# Patient Record
Sex: Female | Born: 2000 | Race: Black or African American | Hispanic: No | Marital: Single | Smoking: Never smoker
Health system: Southern US, Community
[De-identification: ages and names within clinical notes are randomized; demographics above are authoritative.]

## PROBLEM LIST (undated history)

## (undated) DIAGNOSIS — I10 Essential (primary) hypertension: Secondary | ICD-10-CM

## (undated) DIAGNOSIS — J45909 Unspecified asthma, uncomplicated: Secondary | ICD-10-CM

---
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
## 2018-03-23 IMAGING — CR DG CHEST 2V
2 series · 2 of 2 positions shown · non-contrast
Comparison: None.

CLINICAL DATA: Initial evaluation for acute cough, chest pain.

EXAM:
CHEST  2 VIEW

[chest pa]
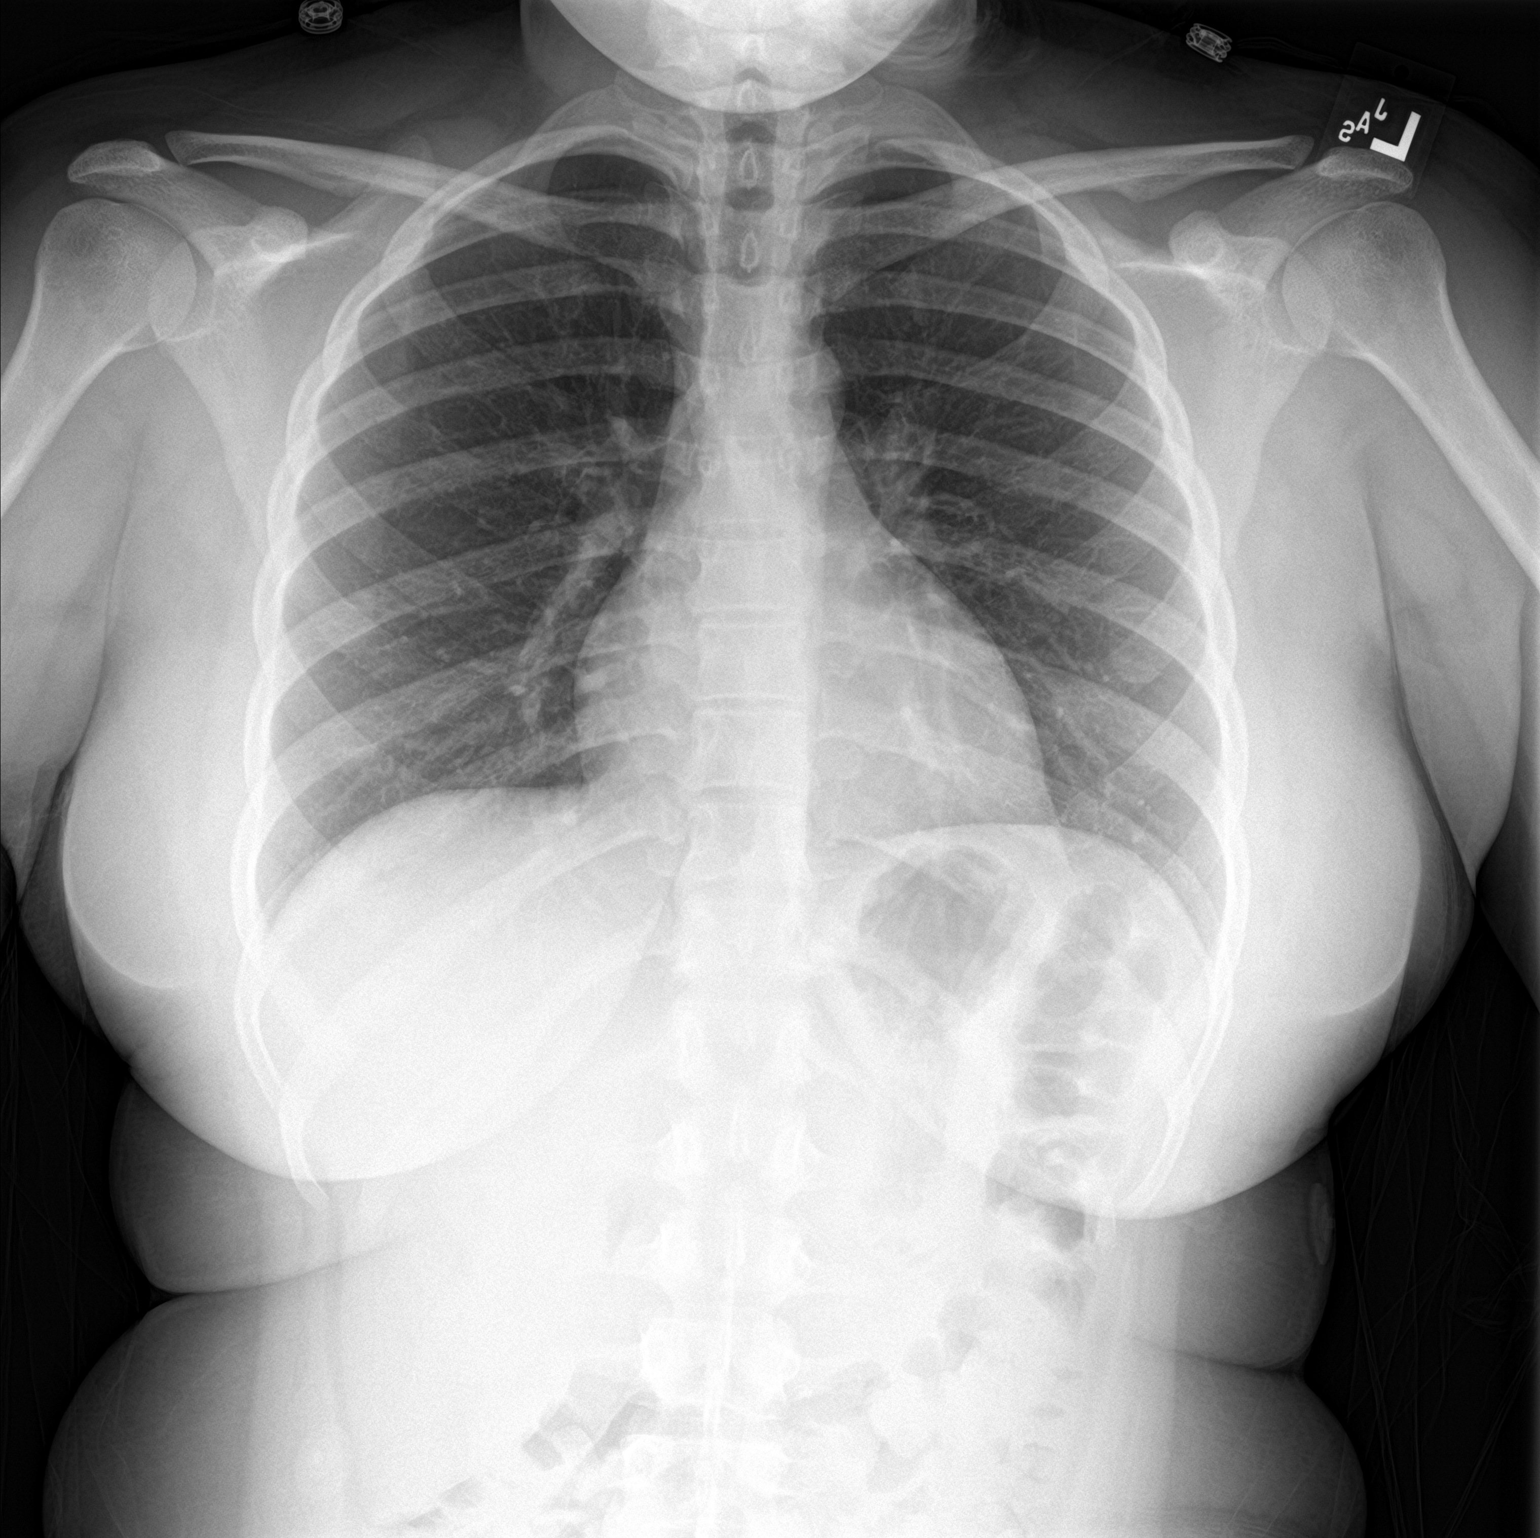

[chest lat]
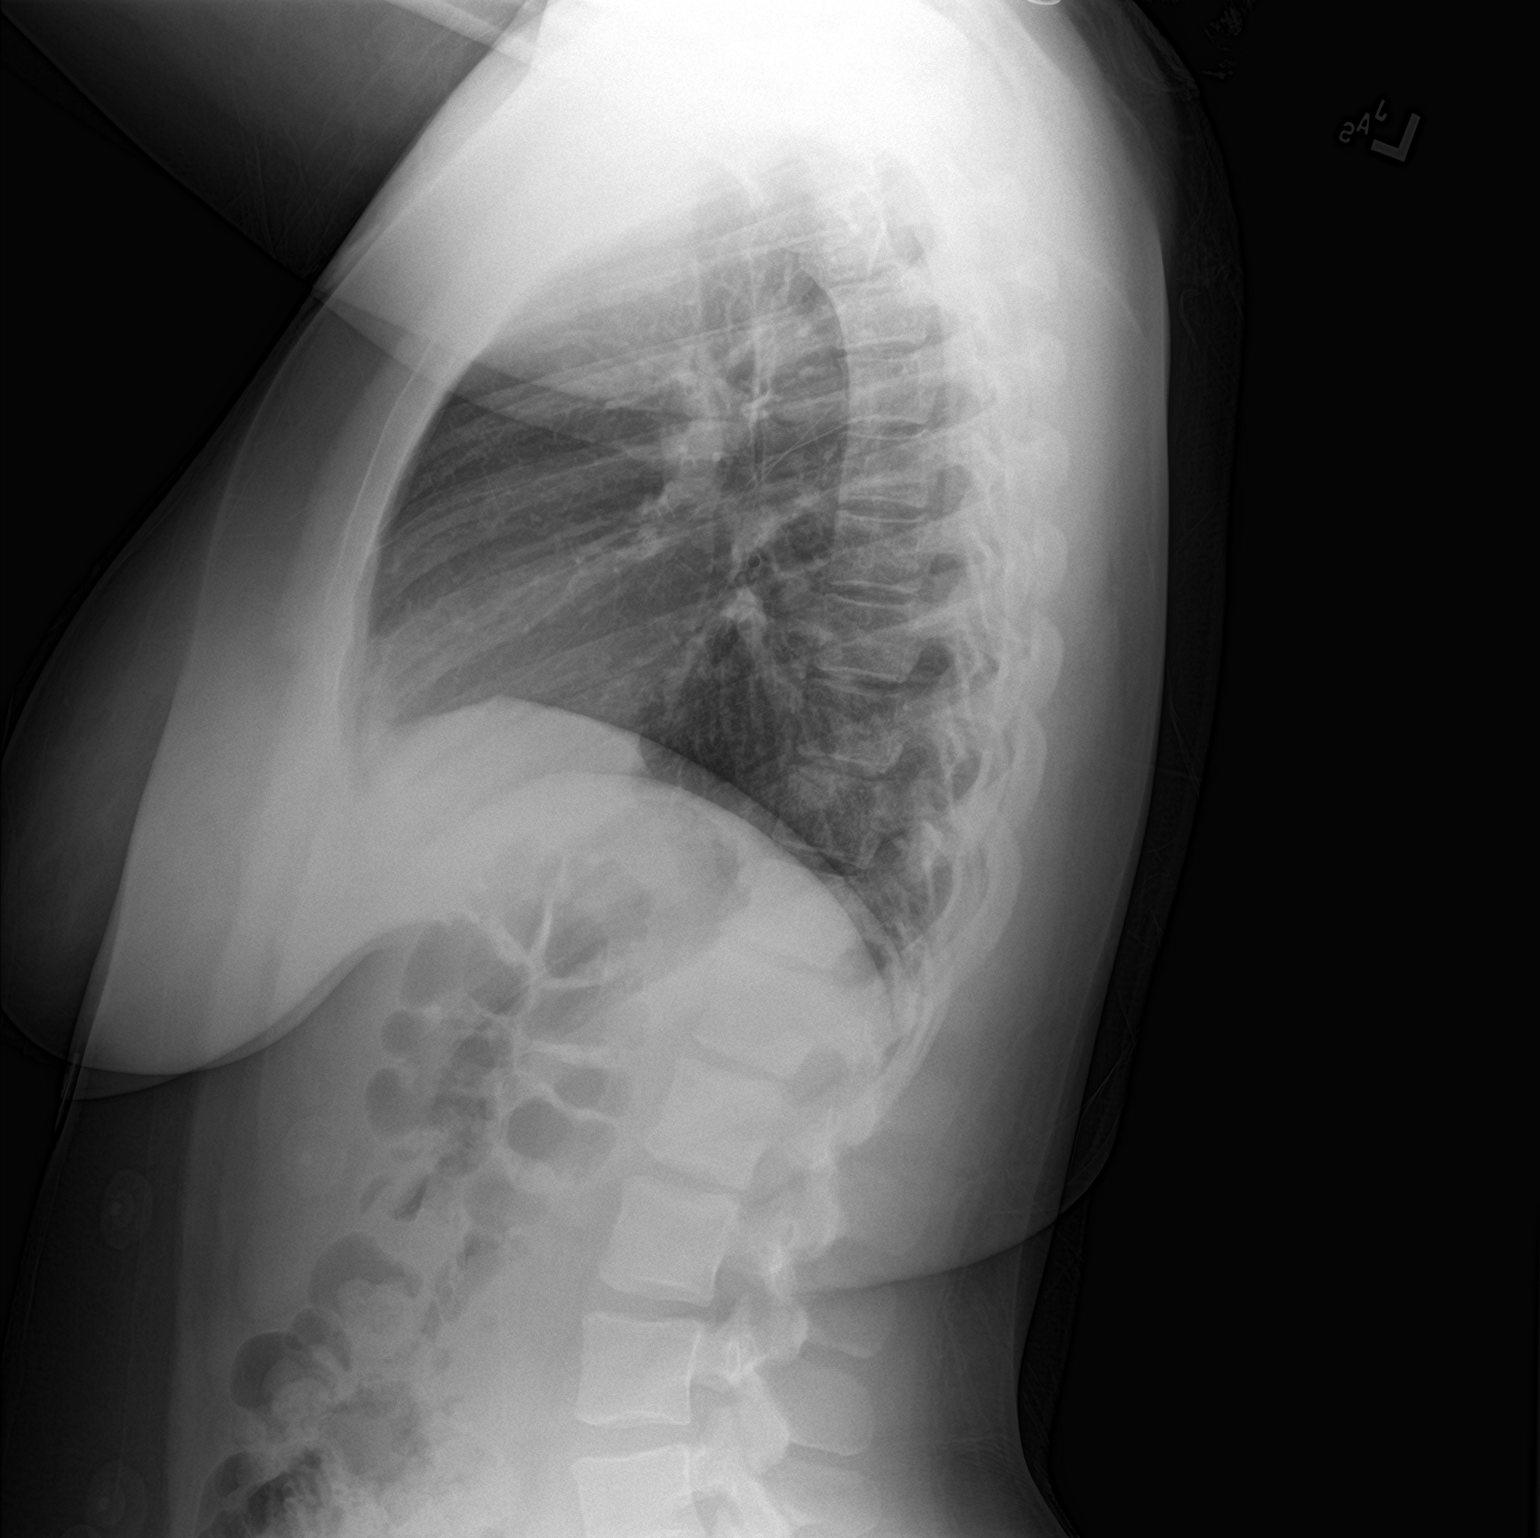

[2 of 2 positions shown; findings below may reference images not displayed]

FINDINGS: The cardiac and mediastinal silhouettes are within normal limits.

The lungs are normally inflated. No airspace consolidation, pleural
effusion, or pulmonary edema is identified. There is no
pneumothorax.

No acute osseous abnormality identified.
IMPRESSION: No a radiographic evidence for active cardiopulmonary disease.
